# Patient Record
Sex: Male | Born: 1945 | Race: Black or African American | Hispanic: No | Marital: Single | Smoking: Current every day smoker
Health system: Southern US, Community
[De-identification: ages and names within clinical notes are randomized; demographics above are authoritative.]

## PROBLEM LIST (undated history)

## (undated) DIAGNOSIS — J449 Chronic obstructive pulmonary disease, unspecified: Secondary | ICD-10-CM

## (undated) DIAGNOSIS — Q240 Dextrocardia: Secondary | ICD-10-CM

## (undated) HISTORY — DX: Dextrocardia: Q24.0

## (undated) HISTORY — DX: Chronic obstructive pulmonary disease, unspecified: J44.9

---

## 2008-12-16 ENCOUNTER — Ambulatory Visit: Payer: Self-pay | Admitting: Unknown Physician Specialty

## 2012-07-12 ENCOUNTER — Ambulatory Visit: Payer: Self-pay | Admitting: Family Medicine

## 2014-06-17 IMAGING — CR DG CHEST 2V
1 series · 2 of 2 positions shown · non-contrast
Comparison: none

REASON FOR EXAM: bronchitis
COMMENTS:

PROCEDURE:     KDR - KDXR CHEST PA (OR AP) AND LAT  - July 12, 2012 [DATE]
RESULT:     Comparison: None

[Series 1: pa · 0.17mm/px · 2 of 2 slices shown]
[im 1/2]
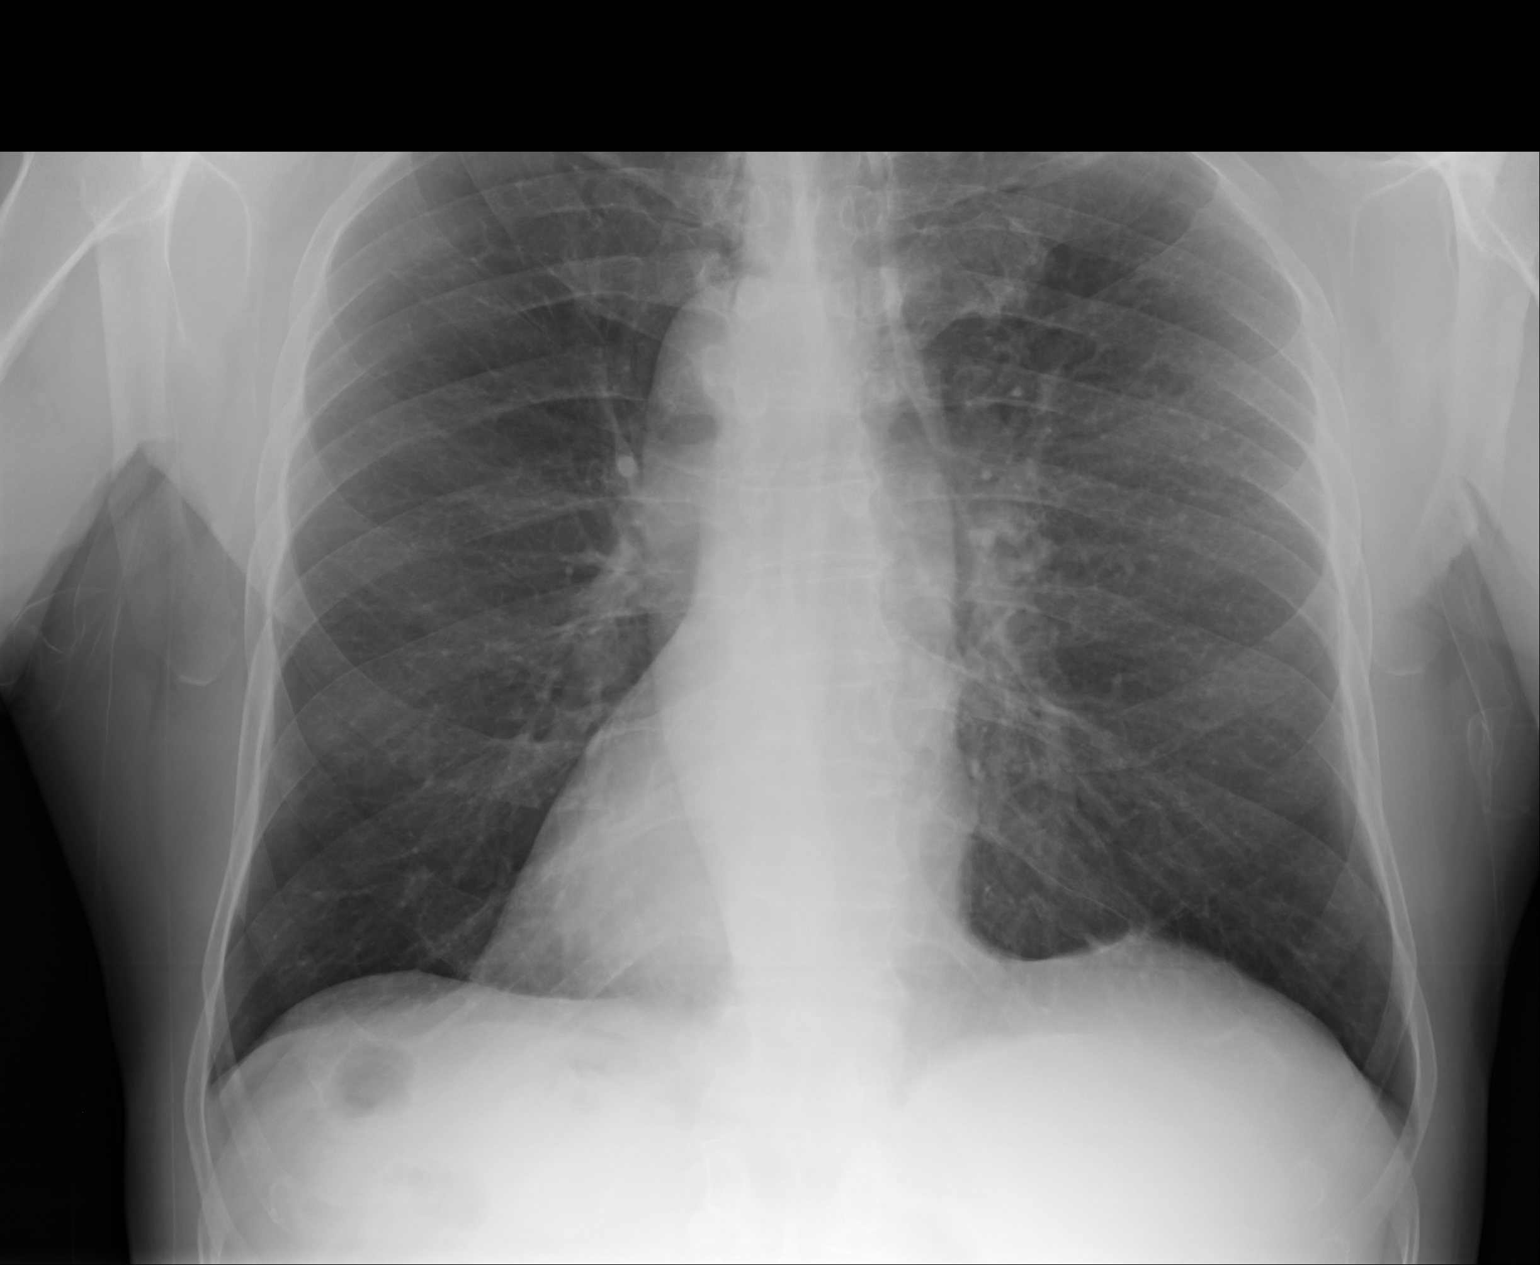
[im 2/2]
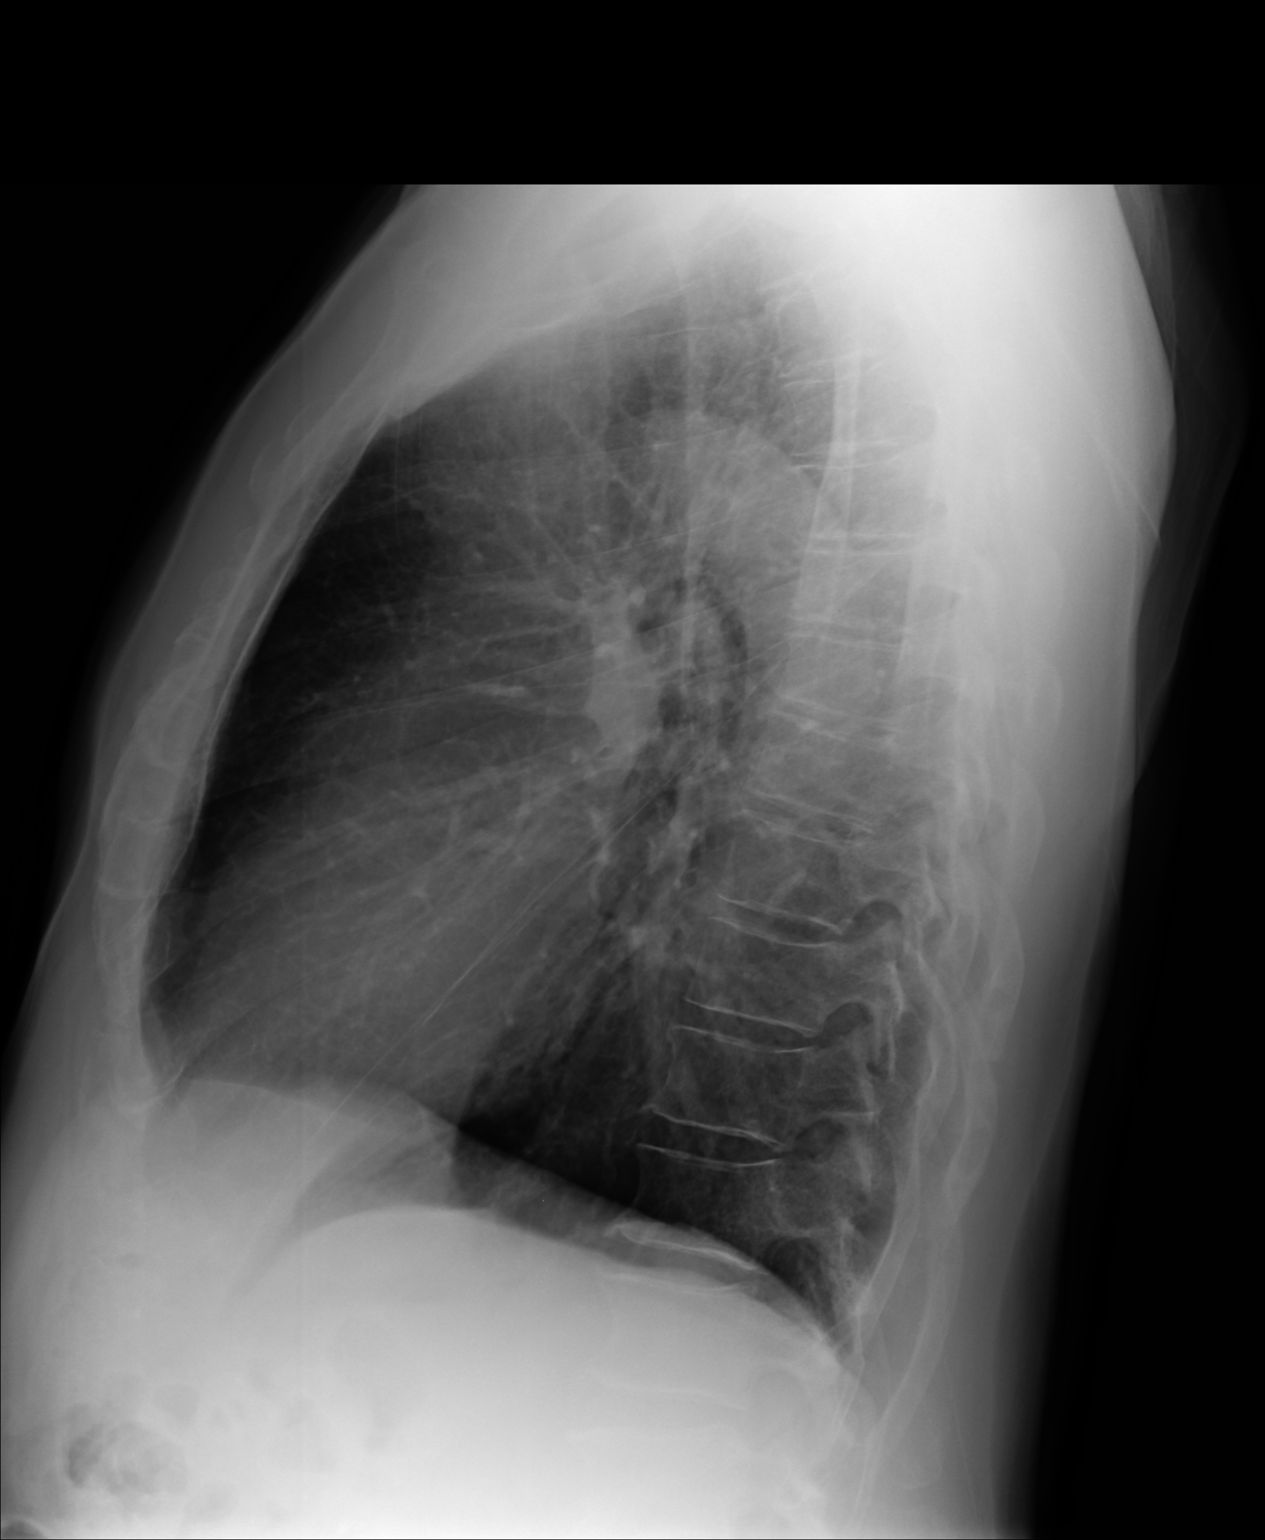

[2 of 2 positions shown; findings below may reference images not displayed]

FINDINGS: PA and lateral chest radiographs are provided.  There is no focal
parenchymal opacity, pleural effusion, or pneumothorax. The heart size is
normal. There is extra cardia. There is likely abdominal situs inversus
present.  The osseous structures are unremarkable.
IMPRESSION: No acute disease of the che[REDACTED]

## 2014-06-17 IMAGING — US US CAROTID DUPLEX BILAT
1 series · 13 of 24 positions shown · non-contrast
Comparison: None

REASON FOR EXAM: dextrocardia
COMMENTS:

PROCEDURE:     OKUNROBO - OKUNROBO CAROTID DOPPLER BILATERAL  - July 12, 2012 [DATE]
RESULT:     Indication: Dextrocardia
TECHNIQUE: Gray-scale, color Doppler, and spectral Doppler images were
obtained of the extracranial carotid artery systems and vertebral arteries
in the neck.

[Series 1: us carotid duplex bilat · 13 of 52 slices shown]
[im 1/52]
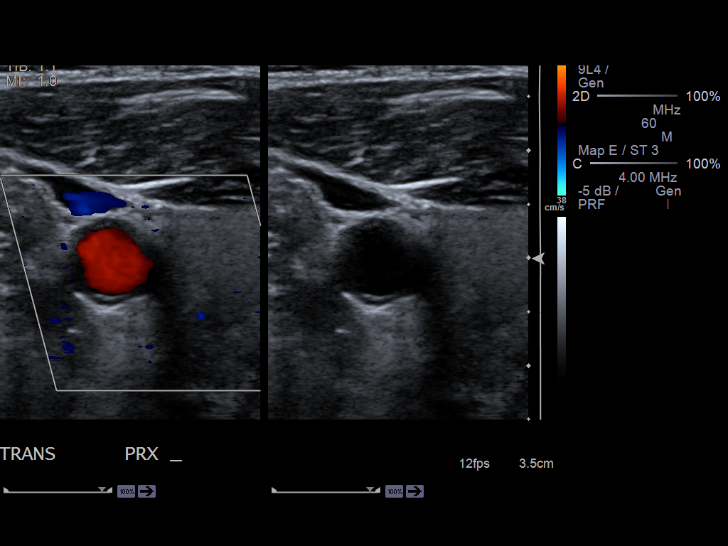
[im 5/52]
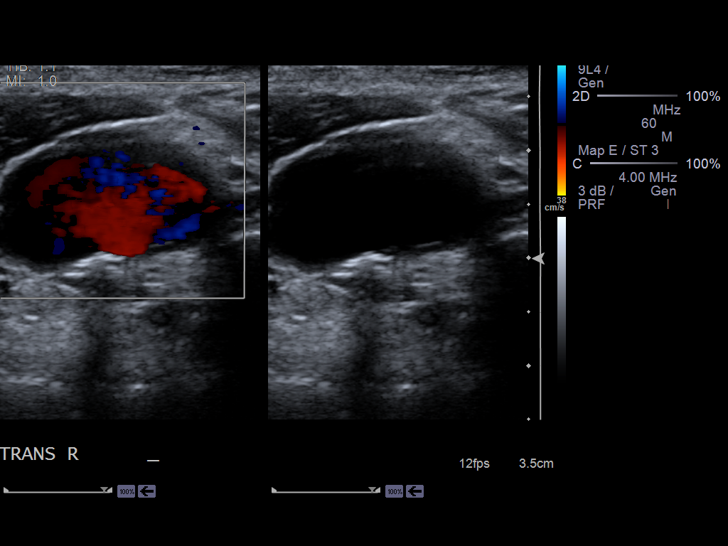
[im 9/52]
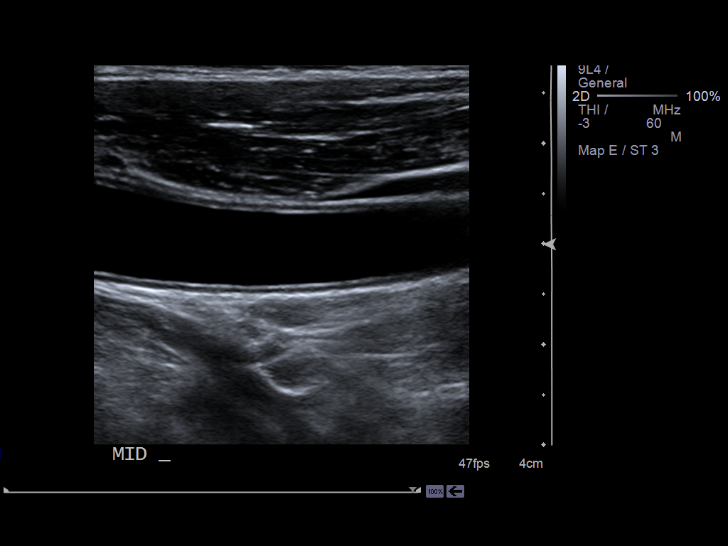
[im 14/52]
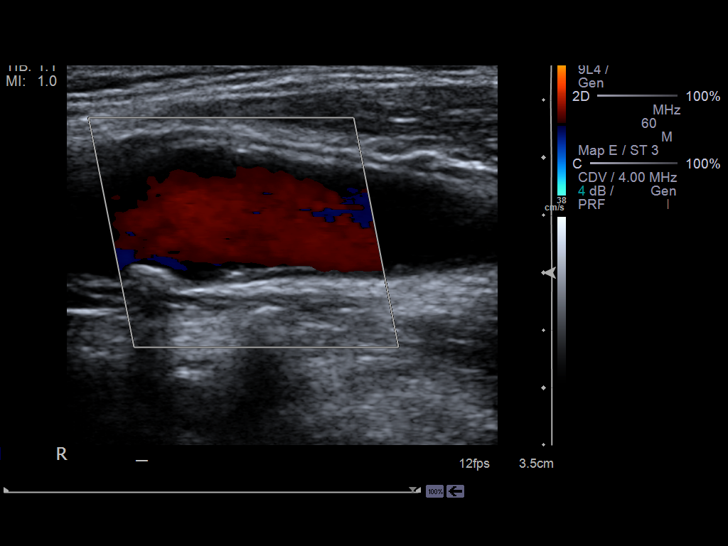
[im 18/52]
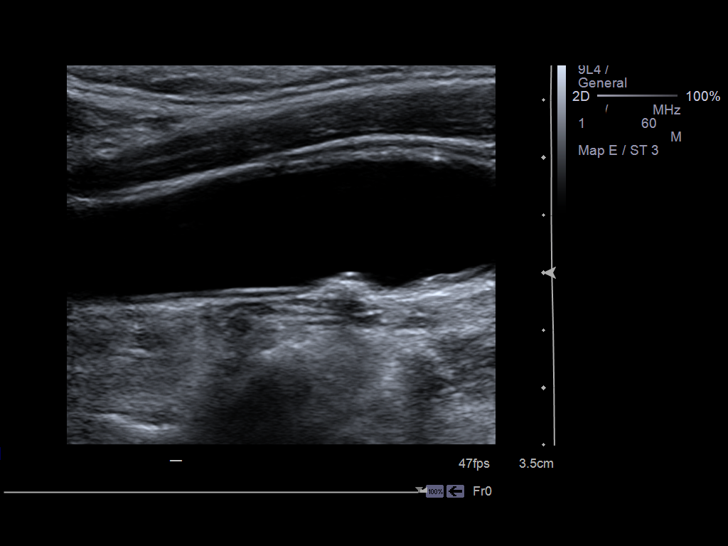
[im 23/52]
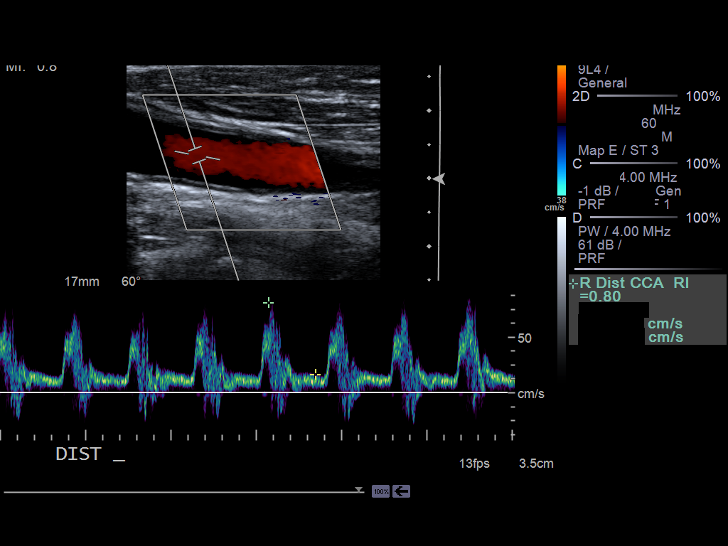
[im 27/52]
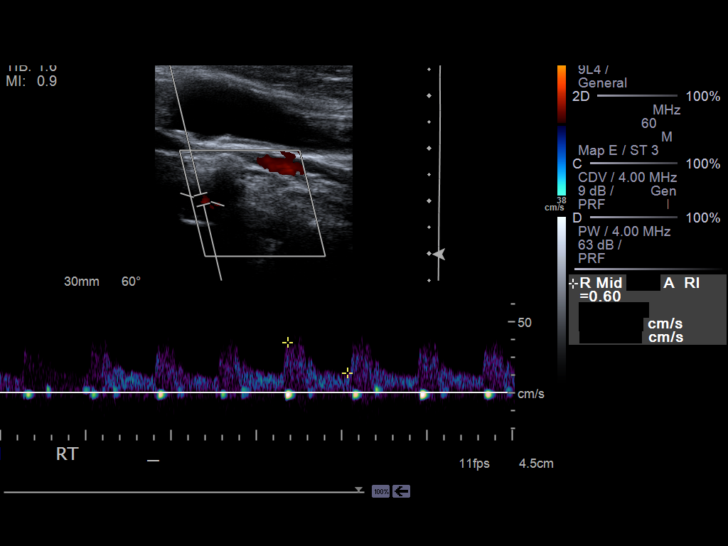
[im 29/52]
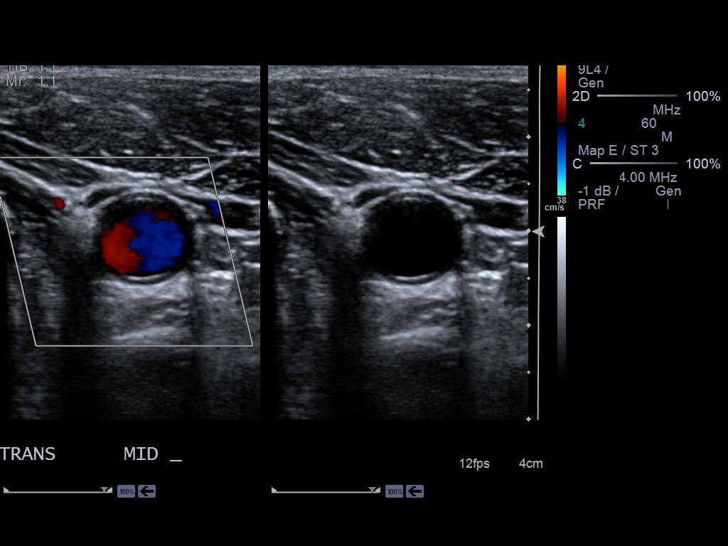
[im 34/52]
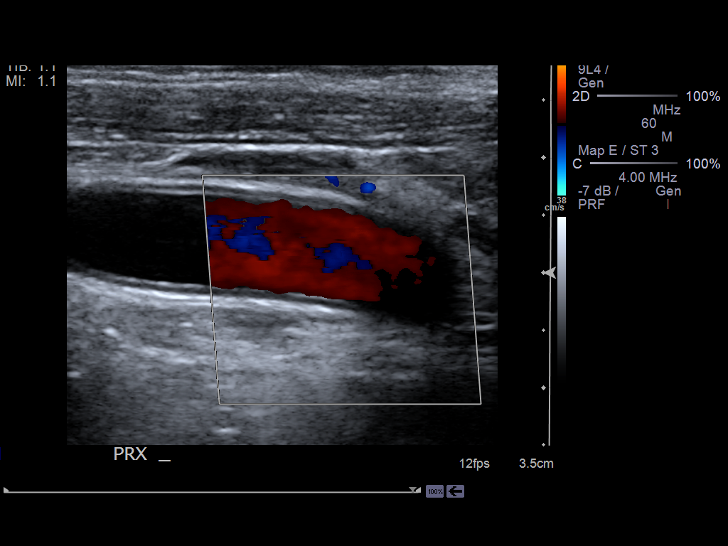
[im 38/52]
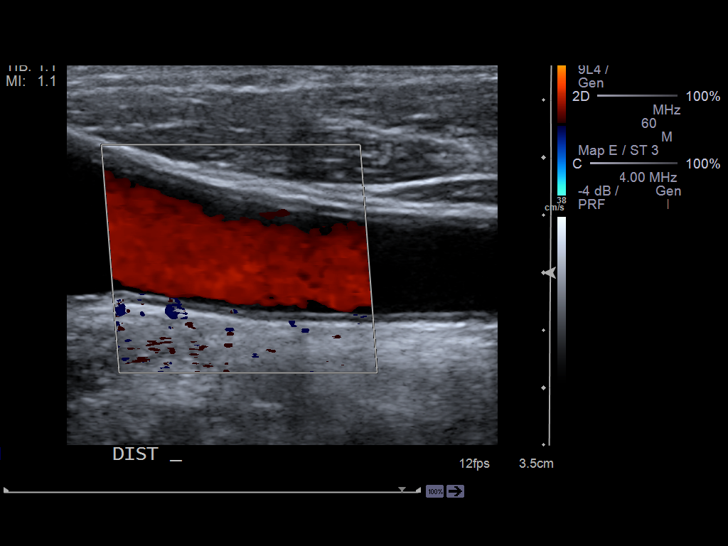
[im 43/52]
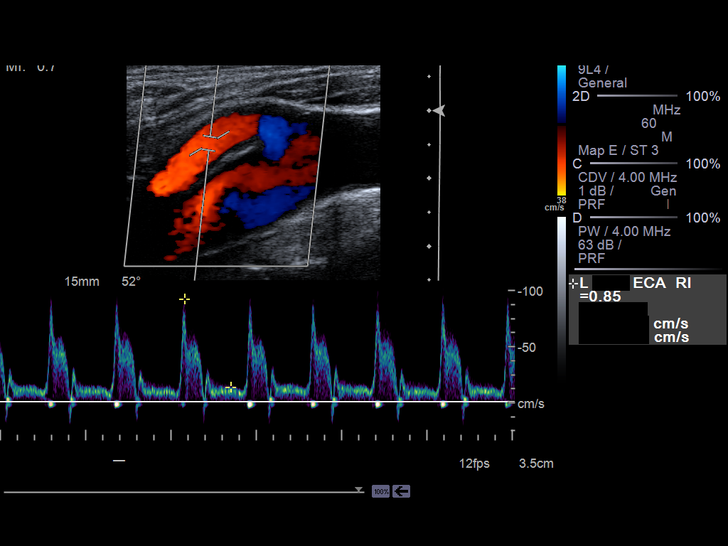
[im 47/52]
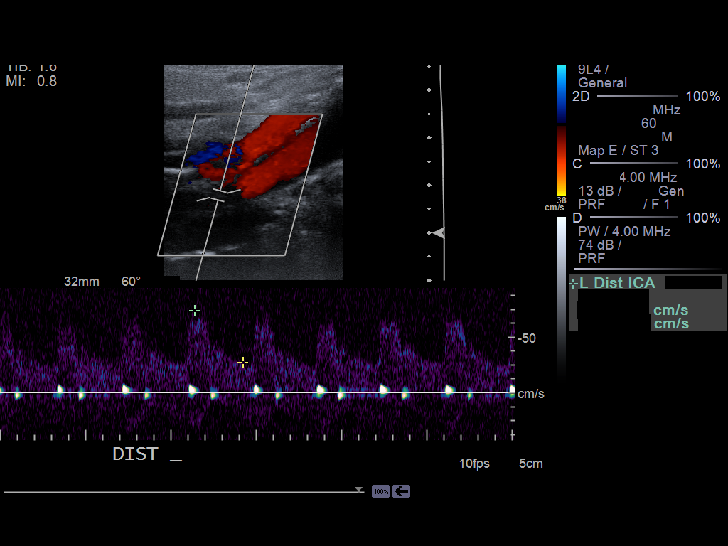
[im 52/52]
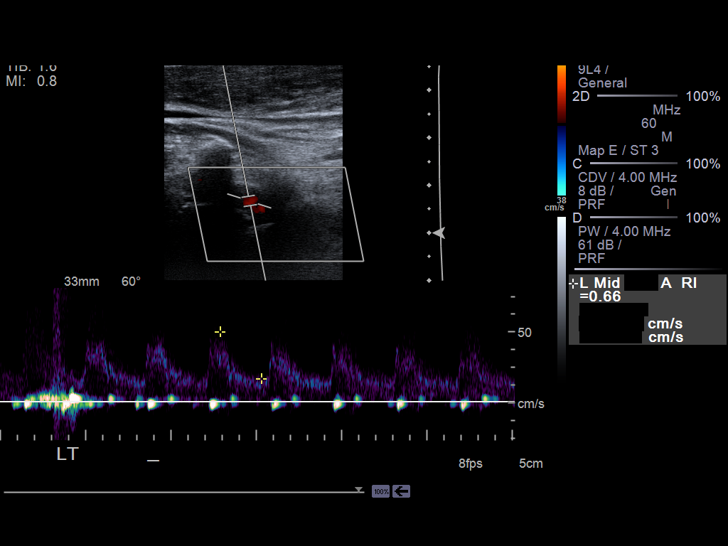

[13 of 24 positions shown; findings below may reference images not displayed]

FINDINGS: On the right, there is minimal after cirrhotic plaque within the carotid
bulb. Maximum peak systolic velocity in the right CCA is 120 cm/second.
Maximum peak systolic velocity in the right ICA is 74 cm/second. Maximum
peak systolic velocity in the right ECA is 111 cm/second. The right ICA/CCA
ratio is 0.61. This corresponds to a stenosis of less than 50 %. Antegrade
blood flow is documented in the right vertebral artery.

On the left, there is no significant atherosclerotic plaque. Maximum peak
systolic velocity in the left CCA is 81 cm/second. Maximum peak systolic
velocity in the left ICA is 74 cm/second. Maximum peak systolic velocity in
the left ECA is 93 cm/second. The left ICA/CCA ratio is 0.9. This
corresponds to a stenosis of less than 50 %. Antegrade blood flow is
documented in the left vertebral artery.
IMPRESSION: 1. No hemodynamically significant carotid artery stenosis.

[REDACTED]

## 2017-03-17 ENCOUNTER — Encounter (INDEPENDENT_AMBULATORY_CARE_PROVIDER_SITE_OTHER): Payer: Self-pay | Admitting: Vascular Surgery

## 2017-03-17 ENCOUNTER — Ambulatory Visit (INDEPENDENT_AMBULATORY_CARE_PROVIDER_SITE_OTHER): Payer: Medicare Other | Admitting: Vascular Surgery

## 2017-03-17 DIAGNOSIS — I70213 Atherosclerosis of native arteries of extremities with intermittent claudication, bilateral legs: Secondary | ICD-10-CM | POA: Diagnosis not present

## 2017-03-17 DIAGNOSIS — J449 Chronic obstructive pulmonary disease, unspecified: Secondary | ICD-10-CM | POA: Diagnosis not present

## 2017-03-17 DIAGNOSIS — I10 Essential (primary) hypertension: Secondary | ICD-10-CM

## 2017-03-20 DIAGNOSIS — I10 Essential (primary) hypertension: Secondary | ICD-10-CM | POA: Insufficient documentation

## 2017-03-20 DIAGNOSIS — I70219 Atherosclerosis of native arteries of extremities with intermittent claudication, unspecified extremity: Secondary | ICD-10-CM | POA: Insufficient documentation

## 2017-03-20 DIAGNOSIS — J449 Chronic obstructive pulmonary disease, unspecified: Secondary | ICD-10-CM | POA: Insufficient documentation

## 2017-03-20 NOTE — Progress Notes (Signed)
MRN : [id number]  Clifford Bryan is a 71 y.o. (08/29/1945) male who presents with chief complaint of  Chief Complaint  Patient presents with  . New Patient (Initial Visit)    ABN ABI  .  History of Present Illness:  The patient is seen for evaluation of painful lower extremities and diminished pulses. Patient notes the pain is always associated with activity and is very consistent day today. Typically, the pain occurs at less than one block, progress is as activity continues to the point that the patient must stop walking. Resting including standing still for several minutes allowed resumption of the activity and the ability to walk a similar distance before stopping again. Uneven terrain and inclined shorten the distance. The pain has been progressive over the past several years. The patient states the inability to walk is now having a profound negative impact on quality of life and daily activities.  The patient denies rest pain or dangling of an extremity off the side of the bed during the night for relief. No open wounds or sores at this time. No prior interventions or surgeries.  No history of back problems or DJD of the lumbar sacral spine.   The patient denies changes in claudication symptoms or new rest pain symptoms.  No new ulcers or wounds of the foot.  The patient's blood pressure has been stable and relatively well controlled. The patient denies amaurosis fugax or recent TIA symptoms. There are no recent neurological changes noted. The patient denies history of DVT, PE or superficial thrombophlebitis. The patient denies recent episodes of angina or shortness of breath.    Current Meds  Medication Sig  . albuterol (PROVENTIL HFA) 108 (90 Base) MCG/ACT inhaler Inhale into the lungs.  Marland Kitchen amLODipine (NORVASC) 2.5 MG tablet Take by mouth.  . cetirizine (ZYRTEC) 10 MG tablet Take by mouth.  . fluticasone (FLONASE) 50 MCG/ACT nasal spray Place into the nose.    Past Medical  History:  Diagnosis Date  . COPD (chronic obstructive pulmonary disease) (HCC)   . Dextrocardia     No past surgical history on file.  Social History Social History  Substance Use Topics  . Smoking status: Current Every Day Smoker  . Smokeless tobacco: Never Used  . Alcohol use Yes    Family History No family history on file. No family history of bleeding/clotting disorders, porphyria or autoimmune disease   Allergies  Allergen Reactions  . Penicillins      REVIEW OF SYSTEMS (Negative unless checked)  Constitutional: [] Weight loss  [] Fever  [] Chills Cardiac: [] Chest pain   [] Chest pressure   [] Palpitations   [] Shortness of breath when laying flat   [x] Shortness of breath with exertion. Vascular:  [x] Pain in legs with walking   [] Pain in legs at rest  [] History of DVT   [] Phlebitis   [] Swelling in legs   [] Varicose veins   [] Non-healing ulcers Pulmonary:   [] Uses home oxygen   [] Productive cough   [] Hemoptysis   [] Wheeze  [] COPD   [] Asthma Neurologic:  [] Dizziness   [] Seizures   [] History of stroke   [] History of TIA  [] Aphasia   [] Vissual changes   [] Weakness or numbness in arm   [] Weakness or numbness in leg Musculoskeletal:   [] Joint swelling   [] Joint pain   [] Low back pain Hematologic:  [] Easy bruising  [] Easy bleeding   [] Hypercoagulable state   [] Anemic Gastrointestinal:  [] Diarrhea   [] Vomiting  [] Gastroesophageal reflux/heartburn   [] Difficulty swallowing. Genitourinary:  []   Chronic kidney disease   [] Difficult urination  [] Frequent urination   [] Blood in urine Skin:  [] Rashes   [] Ulcers  Psychological:  [] History of anxiety   []  History of major depression.  Physical Examination  Vitals:   03/17/17 1130  BP: (!) 157/83  Pulse: 68  Resp: 16  Weight: 178 lb (80.7 kg)  Height: 5\' 8"  (1.727 m)   Body mass index is 27.06 kg/m. Gen: WD/WN, NAD Head: [place]/AT, No temporalis wasting.  Ear/Nose/Throat: Hearing grossly intact, nares w/o erythema or drainage, poor  dentition Eyes: PER, EOMI, sclera nonicteric.  Neck: Supple, no masses.  No bruit or JVD.  Pulmonary:  Good air movement, clear to auscultation bilaterally, no use of accessory muscles.  Cardiac: RRR, normal S1, S2, no Murmurs. Vascular:  Vessel Right Left  Radial Palpable Palpable  Ulnar Palpable Palpable  Brachial Palpable Palpable  Carotid Palpable Palpable  Femoral Trace Palpable Trace Palpable  Popliteal Not Palpable Not Palpable  PT Not Palpable Not Palpable  DP Not Palpable Not Palpable   Gastrointestinal: soft, non-distended. No guarding/no peritoneal signs.  Musculoskeletal: M/S 5/5 throughout.  No deformity or atrophy.  Neurologic: CN 2-12 intact. Pain and light touch intact in extremities.  Symmetrical.  Speech is fluent. Motor exam as listed above. Psychiatric: Judgment intact, Mood & affect appropriate for pt's clinical situation. Dermatologic: No rashes or ulcers noted.  No changes consistent with cellulitis. Lymph : No Cervical lymphadenopathy, no lichenification or skin changes of chronic lymphedema.  CBC No results found for: WBC, HGB, HCT, MCV, PLT  BMET No results found for: NA, K, CL, CO2, GLUCOSE, BUN, CREATININE, CALCIUM, GFRNONAA, GFRAA CrCl cannot be calculated (No order found.).  COAG No results found for: INR, PROTIME  Radiology No results found.  Assessment/Plan 1. Atherosclerosis of native artery of both lower extremities with intermittent claudication (HCC) Recommend:  Patient should undergo arterial duplex of the lower extremity ASAP because there has been a significant deterioration in the patient's lower extremity symptoms.  The patient states they are having increased pain and a marked decrease in the distance that they can walk.  The risks and benefits as well as the alternatives were discussed in detail with the patient.  All questions were answered.  Patient agrees to proceed and understands this could be a prelude to angiography and  intervention.  The patient will follow up with me in the office to review the studies.  - VAS US AORTA/IVC/ILIACS; Future - VAS US LOWER EXTREMITY ARTERIAL DUPLEX; Future  2. Chronic obstructive pulmonary disease, unspecified COPD type (HCC) Continue pulmonary medications and aerosols as already ordered, these medications have been reviewed and there are no changes at this time.    3. Essential hypertension Continue antihypertensive medications as already ordered, these medications have been reviewed and there are no changes at this time.     Levora DredgeGregory Ozie Lupe, MD  03/20/2017 3:39 PM

## 2017-03-30 ENCOUNTER — Encounter (INDEPENDENT_AMBULATORY_CARE_PROVIDER_SITE_OTHER): Payer: Self-pay

## 2017-04-13 ENCOUNTER — Encounter (INDEPENDENT_AMBULATORY_CARE_PROVIDER_SITE_OTHER): Payer: Self-pay | Admitting: Vascular Surgery

## 2017-04-13 ENCOUNTER — Ambulatory Visit (INDEPENDENT_AMBULATORY_CARE_PROVIDER_SITE_OTHER): Payer: Medicare Other

## 2017-04-13 ENCOUNTER — Ambulatory Visit (INDEPENDENT_AMBULATORY_CARE_PROVIDER_SITE_OTHER): Payer: Medicare Other | Admitting: Vascular Surgery

## 2017-04-13 VITALS — BP 155/84 | HR 57 | Resp 17 | Ht 73.0 in | Wt 178.0 lb

## 2017-04-13 DIAGNOSIS — I70213 Atherosclerosis of native arteries of extremities with intermittent claudication, bilateral legs: Secondary | ICD-10-CM | POA: Diagnosis not present

## 2017-04-13 DIAGNOSIS — I10 Essential (primary) hypertension: Secondary | ICD-10-CM

## 2017-04-13 NOTE — Progress Notes (Signed)
Subjective:    Patient ID: Clifford Bryan, male    DOB: 1945/07/10, 71 y.o.   MRN: [id number] Chief Complaint  Patient presents with  . Follow-up    pt conv rle art,aorta iliac   Patient presents to review vascular studies.  The patient was last seen on March 17, 2017 for evaluation of bilateral lower extremity claudication.  The patient's symptoms remained stable.  The patient experiences claudication with activity, left lower extremity is more severely affected when compared to the right.  He has noticed a shortening in his claudication distance.  The patient denies any rest pain or ulcer formation to the bilateral lower extremity.  The patient underwent an aortoiliac arterial duplex exam which was notable for Doppler velocities suggesting greater than 50% stenosis of the bilateral proximal external iliac artery.  No hemodynamically significant stenosis noted in the abdominal aorta or the remainder of the bilateral iliac arteries.  There is no previous duplex exam for comparison.  The patient underwent a bilateral lower extremity arterial duplex exam which was notable for right: Doppler velocities suggesting greater than 50% stenosis of the right proximal and mid superficial femoral artery.  Left: The mid left superficial femoral artery appears occluded with constitution above the knee via collateral flow.  Doppler velocities suggest greater than 50% stenosis of the left proximal superficial femoral artery as well.  There is no previous duplex for comparison.  The patient denies any fever, nausea vomiting.   Review of Systems  Constitutional: Negative.   HENT: Negative.   Eyes: Negative.   Respiratory: Negative.   Cardiovascular:       Lower extremity claudication  Gastrointestinal: Negative.   Endocrine: Negative.   Genitourinary: Negative.   Musculoskeletal: Negative.   Skin: Negative.   Allergic/Immunologic: Negative.   Neurological: Negative.   Hematological: Negative.     Psychiatric/Behavioral: Negative.       Objective:   Physical Exam  Constitutional: He is oriented to person, place, and time. He appears well-developed and well-nourished. No distress.  HENT:  Head: Normocephalic and atraumatic.  Eyes: Conjunctivae are normal. Pupils are equal, round, and reactive to light.  Neck: Normal range of motion.  Cardiovascular: Normal rate, regular rhythm, normal heart sounds and intact distal pulses.  Pulses:      Radial pulses are 2+ on the right side, and 2+ on the left side.  Hard to palpate pedal pulses however his bilateral feet are warm  Pulmonary/Chest: Effort normal and breath sounds normal.  Musculoskeletal: Normal range of motion. He exhibits no edema.  Neurological: He is alert and oriented to person, place, and time.  Skin: Skin is warm and dry. He is not diaphoretic.  Psychiatric: He has a normal mood and affect. His behavior is normal. Judgment and thought content normal.  Vitals reviewed.  BP (!) 155/84 (BP Location: Left Arm)   Pulse (!) 57   Resp 17   Ht 6\' 1"  (1.854 m)   Wt 178 lb (80.7 kg)   BMI 23.48 kg/m   Past Medical History:  Diagnosis Date  . COPD (chronic obstructive pulmonary disease) (HCC)   . Dextrocardia    Social History   Socioeconomic History  . Marital status: Single    Spouse name: Not on file  . Number of children: Not on file  . Years of education: Not on file  . Highest education level: Not on file  Social Needs  . Financial resource strain: Not on file  . Food insecurity -  worry: Not on file  . Food insecurity - inability: Not on file  . Transportation needs - medical: Not on file  . Transportation needs - non-medical: Not on file  Occupational History  . Not on file  Tobacco Use  . Smoking status: Current Every Day Smoker  . Smokeless tobacco: Never Used  Substance and Sexual Activity  . Alcohol use: Yes  . Drug use: No  . Sexual activity: Not on file  Other Topics Concern  . Not on file   Social History Narrative  . Not on file   No family history on file.  Allergies  Allergen Reactions  . Penicillins       Assessment & Plan:  Patient presents to review vascular studies.  The patient was last seen on March 17, 2017 for evaluation of bilateral lower extremity claudication.  The patient's symptoms remained stable.  The patient experiences claudication with activity, left lower extremity is more severely affected when compared to the right.  He has noticed a shortening in his claudication distance.  The patient denies any rest pain or ulcer formation to the bilateral lower extremity.  The patient underwent an aortoiliac arterial duplex exam which was notable for Doppler velocities suggesting greater than 50% stenosis of the bilateral proximal external iliac artery.  No hemodynamically significant stenosis noted in the abdominal aorta or the remainder of the bilateral iliac arteries.  There is no previous duplex exam for comparison.  The patient underwent a bilateral lower extremity arterial duplex exam which was notable for right: Doppler velocities suggesting greater than 50% stenosis of the right proximal and mid superficial femoral artery.  Left: The mid left superficial femoral artery appears occluded with constitution above the knee via collateral flow.  Doppler velocities suggest greater than 50% stenosis of the left proximal superficial femoral artery as well.  There is no previous duplex for comparison.  The patient denies any fever, nausea vomiting.  1. Atherosclerosis of native artery of both lower extremities with intermittent claudication (HCC) - Stable Patient with bilateral peripheral artery disease to the lower extremity. The patient with significant left lower extremity lifestyle limiting claudication. There is no acute vascular compromise to the patient's lower extremity however I do recommend a left lower extremity angiogram with possible intervention to assess the  patient's anatomy and revascularize the leg. He would also benefit from a right lower extremity angiogram however since his left lower extremity is more symptomatic I recommend starting with this limb. At this time, the patient would like to discuss his duplex results with his primary care physician before moving forward. The patient was given information on peripheral artery disease, angiograms and his test results from today to bring to his primary care physician. The patient is to call our office if he decides to move forward with the endovascular intervention in the future.  2. Essential hypertension - Stable Encouraged good control as its slows the progression of atherosclerotic disease  Current Outpatient Medications on File Prior to Visit  Medication Sig Dispense Refill  . albuterol (PROVENTIL HFA) 108 (90 Base) MCG/ACT inhaler Inhale into the lungs.    Marland Kitchen. amLODipine (NORVASC) 2.5 MG tablet Take by mouth.    . cetirizine (ZYRTEC) 10 MG tablet Take by mouth.    . fluticasone (FLONASE) 50 MCG/ACT nasal spray Place into the nose.     No current facility-administered medications on file prior to visit.    There are no Patient Instructions on file for this visit. No Follow-up  on file.  Caitlynne Harbeck A Keenan Trefry, PA-C

## 2021-03-16 ENCOUNTER — Emergency Department
Admission: EM | Admit: 2021-03-16 | Discharge: 2021-03-16 | Disposition: A | Discharge status: Home / Self Care | Payer: Medicare Other | Attending: Emergency Medicine | Admitting: Emergency Medicine

## 2021-03-16 ENCOUNTER — Other Ambulatory Visit: Payer: Self-pay

## 2021-03-16 DIAGNOSIS — R040 Epistaxis: Secondary | ICD-10-CM | POA: Diagnosis not present

## 2021-03-16 DIAGNOSIS — F17210 Nicotine dependence, cigarettes, uncomplicated: Secondary | ICD-10-CM | POA: Insufficient documentation

## 2021-03-16 DIAGNOSIS — J449 Chronic obstructive pulmonary disease, unspecified: Secondary | ICD-10-CM | POA: Diagnosis not present

## 2021-03-16 LAB — CBC WITH DIFFERENTIAL/PLATELET
Abs Immature Granulocytes: 0.01 10*3/uL (ref 0.00–0.07)
Basophils Absolute: 0.1 10*3/uL (ref 0.0–0.1)
Basophils Relative: 1 %
Eosinophils Absolute: 0.3 10*3/uL (ref 0.0–0.5)
Eosinophils Relative: 7 %
HCT: 37.2 % — ABNORMAL LOW (ref 39.0–52.0)
Hemoglobin: 13.2 g/dL (ref 13.0–17.0)
Immature Granulocytes: 0 %
Lymphocytes Relative: 27 %
Lymphs Abs: 1.2 10*3/uL (ref 0.7–4.0)
MCH: 32.2 pg (ref 26.0–34.0)
MCHC: 35.5 g/dL (ref 30.0–36.0)
MCV: 90.7 fL (ref 80.0–100.0)
Monocytes Absolute: 0.5 10*3/uL (ref 0.1–1.0)
Monocytes Relative: 12 %
Neutro Abs: 2.5 10*3/uL (ref 1.7–7.7)
Neutrophils Relative %: 53 %
Platelets: 149 10*3/uL — ABNORMAL LOW (ref 150–400)
RBC: 4.1 MIL/uL — ABNORMAL LOW (ref 4.22–5.81)
RDW: 13.4 % (ref 11.5–15.5)
WBC: 4.7 10*3/uL (ref 4.0–10.5)
nRBC: 0 % (ref 0.0–0.2)

## 2021-03-16 LAB — COMPREHENSIVE METABOLIC PANEL
ALT: 21 U/L (ref 0–44)
AST: 25 U/L (ref 15–41)
Albumin: 4 g/dL (ref 3.5–5.0)
Alkaline Phosphatase: 68 U/L (ref 38–126)
Anion gap: 8 (ref 5–15)
BUN: 10 mg/dL (ref 8–23)
CO2: 28 mmol/L (ref 22–32)
Calcium: 9.3 mg/dL (ref 8.9–10.3)
Chloride: 106 mmol/L (ref 98–111)
Creatinine, Ser: 0.81 mg/dL (ref 0.61–1.24)
GFR, Estimated: >60 mL/min (ref >60)
Glucose, Bld: 110 mg/dL — ABNORMAL HIGH (ref 70–99)
Potassium: 3.8 mmol/L (ref 3.5–5.1)
Sodium: 142 mmol/L (ref 135–145)
Total Bilirubin: 1.5 mg/dL — ABNORMAL HIGH (ref 0.3–1.2)
Total Protein: 7.6 g/dL (ref 6.5–8.1)

## 2021-03-16 NOTE — ED Triage Notes (Signed)
Pt states that he had a nose bleed on Friday, Saturday, skipped Sunday, and another today, pt went to [facility] who sent [facility] here, pt reports that he placed tissue in his nose and it stopped it and hasn't started back, pt states that as a child he had frequent nosebleeds that stopped when he turned 16. Pt states that he has been having some left sided facial discomfort in his sinus area

## 2021-03-16 NOTE — Discharge Instructions (Signed)
Keep your appointment with your PCP on November 14. Please tell them that your blood pressure has been elevated at home and you have been having nosebleeds. You can apply some Vaseline to the left nose to keep it moisturized. Sometimes dry warm air can worsen nosebleeds.

## 2021-03-16 NOTE — ED Provider Notes (Signed)
[facility]-EMERGENCY DEPARTMENT  ____________________________________________  Time seen: Approximately 3:40 PM  I have reviewed the triage vital signs and the nursing notes.   HISTORY  Chief Complaint Epistaxis   Historian Patient    HPI Clifford Bryan is a 75 y.o. male presents to the emergency department with epistaxis from the left nare.  Patient reports that he had nosebleeds on Friday and Saturday and did have a nosebleed on Sunday.  He reports that he had epistaxis frequently as a younger adult and was told that he has friable blood vessels in his nose.  He denies new allergy medications.  He states that his blood pressure has been elevated some at home.  No chest pain, chest tightness or abdominal pain.  He has been afebrile.   Past Medical History:  Diagnosis Date   COPD (chronic obstructive pulmonary disease) (HCC)    Dextrocardia      Immunizations up to date:  Yes.     Past Medical History:  Diagnosis Date   COPD (chronic obstructive pulmonary disease) (HCC)    Dextrocardia     Patient Active Problem List   Diagnosis Date Noted   Atherosclerosis of native arteries of extremity with intermittent claudication (HCC) 03/20/2017   COPD (chronic obstructive pulmonary disease) (HCC) 03/20/2017   Essential hypertension 03/20/2017    No past surgical history on file.  Prior to Admission medications   Medication Sig Start Date End Date Taking? Authorizing Provider  albuterol (PROVENTIL HFA) 108 (90 Base) MCG/ACT inhaler Inhale into the lungs. 08/30/16 08/30/17  [provider]  amLODipine (NORVASC) 2.5 MG tablet Take by mouth.    [provider]  cetirizine (ZYRTEC) 10 MG tablet Take by mouth.    [provider]  fluticasone (FLONASE) 50 MCG/ACT nasal spray Place into the nose.    [provider]    Allergies Penicillins  No family history on file.  Social History Social History   Tobacco Use   Smoking status: Every Day    Smokeless tobacco: Never  Substance Use Topics   Alcohol use: Yes   Drug use: No     Review of Systems  Constitutional: No fever/chills Eyes:  No discharge ENT: Patient has epistaxis.  Respiratory: no cough. No SOB/ use of accessory muscles to breath Gastrointestinal:   No nausea, no vomiting.  No diarrhea.  No constipation. Musculoskeletal: Negative for musculoskeletal pain. Skin: Negative for rash, abrasions, lacerations, ecchymosis.    ____________________________________________   PHYSICAL EXAM:  VITAL SIGNS: ED Triage Vitals  Enc Vitals Group     BP 03/16/21 1453 (!) 161/75     Pulse Rate 03/16/21 1453 66     Resp 03/16/21 1453 16     Temp 03/16/21 1453 98.2 F (36.8 C)     Temp Source 03/16/21 1453 Oral     SpO2 03/16/21 1453 98 %     Weight 03/16/21 1454 185 lb (83.9 kg)     Height 03/16/21 1454 6\' 1"  (1.854 m)     Head Circumference --      Peak Flow --      Pain Score 03/16/21 1454 0     Pain Loc --      Pain Edu? --      Excl. in GC? --      Constitutional: Alert and oriented. Well appearing and in no acute distress. Eyes: Conjunctivae are normal. PERRL. EOMI. Head: Atraumatic. ENT:      Nose: No congestion/rhinnorhea.  Patient has resolved epistaxis of  left nare.      Mouth/Throat: Mucous membranes are moist.  Neck: No stridor.  No cervical spine tenderness to palpation. Cardiovascular: Normal rate, regular rhythm. Normal S1 and S2.  Good peripheral circulation. Respiratory: Normal respiratory effort without tachypnea or retractions. Lungs CTAB. Good air entry to the bases with no decreased or absent breath sounds Gastrointestinal: Bowel sounds x 4 quadrants. Soft and nontender to palpation. No guarding or rigidity. No distention. Musculoskeletal: Full range of motion to all extremities. No obvious deformities noted Neurologic:  Normal for age. No gross focal neurologic deficits are appreciated.  Skin:  Skin is warm, dry and intact. No rash  noted. Psychiatric: Mood and affect are normal for age. Speech and behavior are normal.   ____________________________________________   LABS (all labs ordered are listed, but only abnormal results are displayed)  Labs Reviewed  CBC WITH DIFFERENTIAL/PLATELET - Abnormal; Notable for the following components:      Result Value   RBC 4.10 (*)    HCT 37.2 (*)    Platelets 149 (*)    All other components within normal limits  COMPREHENSIVE METABOLIC PANEL - Abnormal; Notable for the following components:   Glucose, Bld 110 (*)    Total Bilirubin 1.5 (*)    All other components within normal limits   ____________________________________________  EKG   ____________________________________________  RADIOLOGY   No results found.  ____________________________________________    PROCEDURES  Procedure(s) performed:     Procedures     Medications - No data to display   ____________________________________________   INITIAL IMPRESSION / ASSESSMENT AND PLAN / ED COURSE  Pertinent labs & imaging results that were available during my care of the patient were reviewed by me and considered in my medical decision making (see chart for details).      Assessment and plan:  Epistaxis:  75 year old male presents to the emergency department with resolved epistaxis of the left nare.  Patient was hypertensive at triage but vital signs were otherwise reassuring.  CBC and CMP were reassuring.  Recommended apply application of Vaseline to left near and follow-up with PCP regarding hypertension noted at triage.  Patient has an appointment on November 15 for follow-up.  Return precautions were given to return with new or worsening symptoms.  All patient questions were answered.    ____________________________________________  FINAL CLINICAL IMPRESSION(S) / ED DIAGNOSES  Final diagnoses:  Epistaxis      NEW MEDICATIONS STARTED DURING THIS VISIT:  ED Discharge Orders      None           This chart was dictated using voice recognition software/Dragon. Despite best efforts to proofread, errors can occur which can change the meaning. Any change was purely unintentional.     Orvil Feil, PA-C 03/16/21 1730    Chesley Noon, MD 03/16/21 2127

## 2023-07-11 ENCOUNTER — Other Ambulatory Visit: Payer: Self-pay | Admitting: Internal Medicine

## 2023-07-11 ENCOUNTER — Ambulatory Visit
Admission: RE | Admit: 2023-07-11 | Discharge: 2023-07-11 | Disposition: A | Discharge status: Home / Self Care | Payer: Medicare Other | Source: Ambulatory Visit | Attending: Internal Medicine | Admitting: Internal Medicine

## 2023-07-11 DIAGNOSIS — M79651 Pain in right thigh: Secondary | ICD-10-CM | POA: Diagnosis present
